# Patient Record
Sex: Male | Born: 1995 | Race: Black or African American | Hispanic: No | Marital: Single | Smoking: Never smoker
Health system: Southern US, Community
[De-identification: ages and names within clinical notes are randomized; demographics above are authoritative.]

---

## 2019-05-13 ENCOUNTER — Other Ambulatory Visit: Payer: Self-pay

## 2019-05-13 ENCOUNTER — Emergency Department
Admission: EM | Admit: 2019-05-13 | Discharge: 2019-05-13 | Disposition: A | Discharge status: Home / Self Care | Payer: BLUE CROSS/BLUE SHIELD | Attending: Emergency Medicine | Admitting: Emergency Medicine

## 2019-05-13 ENCOUNTER — Encounter: Payer: Self-pay | Admitting: Emergency Medicine

## 2019-05-13 ENCOUNTER — Emergency Department: Payer: BLUE CROSS/BLUE SHIELD

## 2019-05-13 DIAGNOSIS — M66241 Spontaneous rupture of extensor tendons, right hand: Secondary | ICD-10-CM | POA: Diagnosis not present

## 2019-05-13 DIAGNOSIS — S56419A Strain of extensor muscle, fascia and tendon of finger, unspecified finger at forearm level, initial encounter: Secondary | ICD-10-CM

## 2019-05-13 DIAGNOSIS — M79644 Pain in right finger(s): Secondary | ICD-10-CM | POA: Diagnosis present

## 2019-05-13 NOTE — Discharge Instructions (Addendum)
Follow-up with your regular hand specialist or Dr. Rosita Kea.  Please call for an appointment.  Wear the finger splint until evaluated by orthopedics

## 2019-05-13 NOTE — ED Triage Notes (Signed)
Pt to ED via POV c/o pain in right middle finger. Pt states that he was cleaning something up off the carpet and heard something pop in his finger. Pt is in NAD.

## 2019-05-13 NOTE — ED Provider Notes (Signed)
[facility] Emergency Department Provider Note  ____________________________________________   First MD Initiated Contact with Patient 05/13/19 1909     (approximate)  I have reviewed the triage vital signs and the nursing notes.   HISTORY  Chief Complaint Finger Injury    HPI Lee Wells is a 24 y.o. male presents emergency department with concerns of right middle finger pain.  States he was cleaning the carpet and felt a pop in the distal end of his finger.  Now I cannot extend the finger.  No numbness or tingling.    History reviewed. No pertinent past medical history.  There are no problems to display for this patient.   History reviewed. No pertinent surgical history.  Prior to Admission medications   Not on File    Allergies Patient has no known allergies.  No family history on file.  Social History Social History   Tobacco Use  . Smoking status: Never Smoker  . Smokeless tobacco: Never Used  Substance Use Topics  . Alcohol use: Yes  . Drug use: Not Currently    Review of Systems  Constitutional: No fever/chills Eyes: No visual changes. ENT: No sore throat. Respiratory: Denies cough Genitourinary: Negative for dysuria. Musculoskeletal: Negative for back pain.  Right middle finger pain Skin: Negative for rash. Psychiatric: no mood changes,     ____________________________________________   PHYSICAL EXAM:  VITAL SIGNS: ED Triage Vitals  Enc Vitals Group     BP 05/13/19 1853 126/69     Pulse Rate 05/13/19 1853 74     Resp 05/13/19 1853 18     Temp 05/13/19 1853 98.3 F (36.8 C)     Temp Source 05/13/19 1853 Oral     SpO2 05/13/19 1853 97 %     Weight 05/13/19 1856 238 lb (108 kg)     Height 05/13/19 1856 5\' 9"  (1.753 m)     Head Circumference --      Peak Flow --      Pain Score 05/13/19 1855 1     Pain Loc --      Pain Edu? --      Excl. in GC? --     Constitutional: Alert and oriented. Well  appearing and in no acute distress. Eyes: Conjunctivae are normal.  Head: Atraumatic. Nose: No congestion/rhinnorhea. Mouth/Throat: Mucous membranes are moist.   Neck:  supple no lymphadenopathy noted Cardiovascular: Normal rate, regular rhythm.  Respiratory: Normal respiratory effort.  No retractions, GU: deferred Musculoskeletal: Decreased range of motion of the distal portion of the right middle finger, neurovascular is intact, minimal swelling is noted  neurologic:  Normal speech and language.  Skin:  Skin is warm, dry and intact. No rash noted. Psychiatric: Mood and affect are normal. Speech and behavior are normal.  ____________________________________________   LABS (all labs ordered are listed, but only abnormal results are displayed)  Labs Reviewed - No data to display ____________________________________________   ____________________________________________  RADIOLOGY  X-ray of the right middle finger is negative  ____________________________________________   PROCEDURES  Procedure(s) performed: Finger splint applied by nursing staff   Procedures    ____________________________________________   INITIAL IMPRESSION / ASSESSMENT AND PLAN / ED COURSE  Pertinent labs & imaging results that were available during my care of the patient were reviewed by me and considered in my medical decision making (see chart for details).   Patient is a 24 year old male presents emergency department with concerns of right middle finger injury.  See HPI  Physical exam shows patient to appear well.  Right middle finger has decreased range of motion with extension.  X-rays right middle finger is negative for fracture  Finger splint was applied to the right middle finger.  Explained to the patient that it is a tendon injury not a bone injury.  He is to follow-up with orthopedics.  He already has a appointment with a hand specialist at [facility].  He can follow-up with them or either  Dr. Truitt Leep who is on-call for today.  States he understands will comply.  He was discharged stable condition.    Lee Wells was evaluated in Emergency Department on 05/13/2019 for the symptoms described in the history of present illness. He was evaluated in the context of the global COVID-19 pandemic, which necessitated consideration that the patient might be at risk for infection with the SARS-CoV-2 virus that causes COVID-19. Institutional protocols and algorithms that pertain to the evaluation of patients at risk for COVID-19 are in a state of rapid change based on information released by regulatory bodies including the CDC and federal and state organizations. These policies and algorithms were followed during the patient's care in the ED.   As part of my medical decision making, I reviewed the following data within the [place] notes reviewed and incorporated, Old chart reviewed, Notes from prior ED visits and [place] Controlled Substance Database  ____________________________________________   FINAL CLINICAL IMPRESSION(S) / ED DIAGNOSES  Final diagnoses:  Rupture of extensor tendon of finger      NEW MEDICATIONS STARTED DURING THIS VISIT:  New Prescriptions   No medications on file     Note:  This document was prepared using Dragon voice recognition software and may include unintentional dictation errors.    Versie Starks, PA-C 05/13/19 Wyatt Portela, MD 05/16/19 2039

## 2021-03-12 IMAGING — DX DG FINGER MIDDLE 2+V*R*
3 series · 3 of 3 positions shown · non-contrast
Comparison: None.

CLINICAL DATA: Right middle finger pain, injury

EXAM:
RIGHT MIDDLE FINGER 2+V

[finger ap]
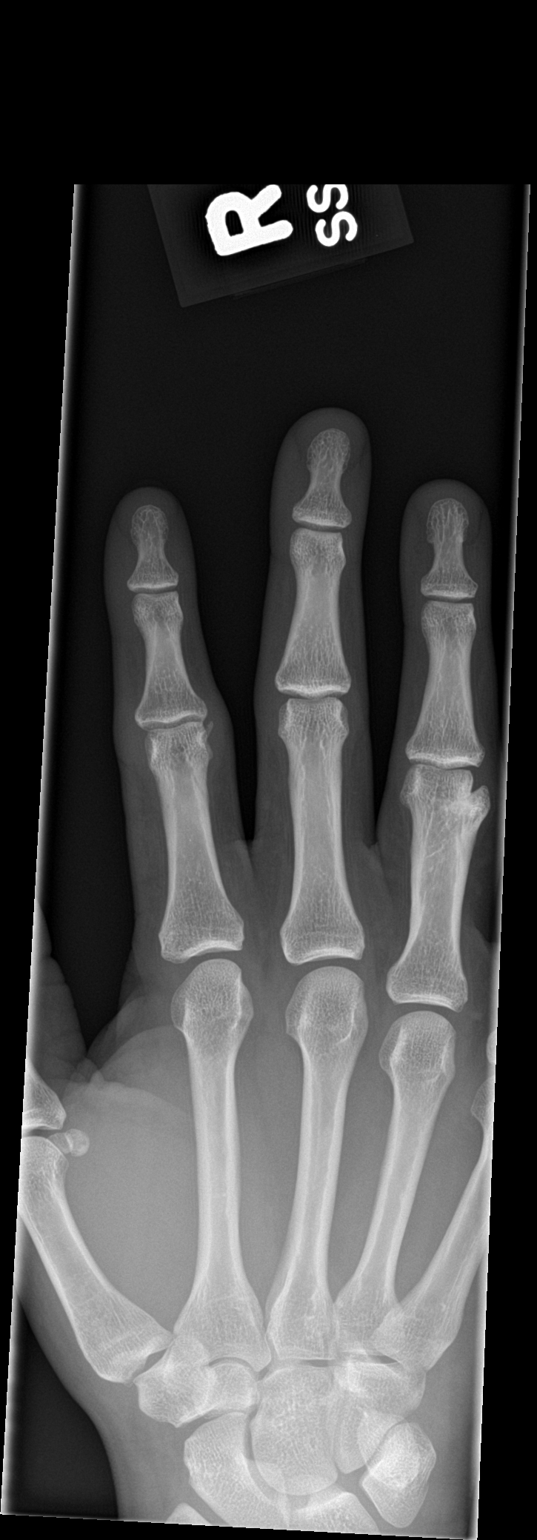

[finger obl]
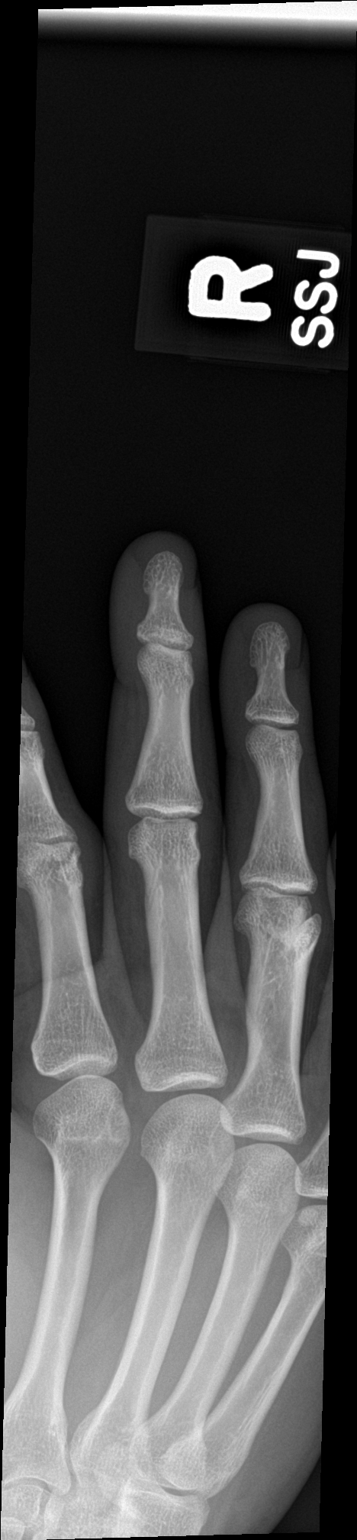

[finger lat]
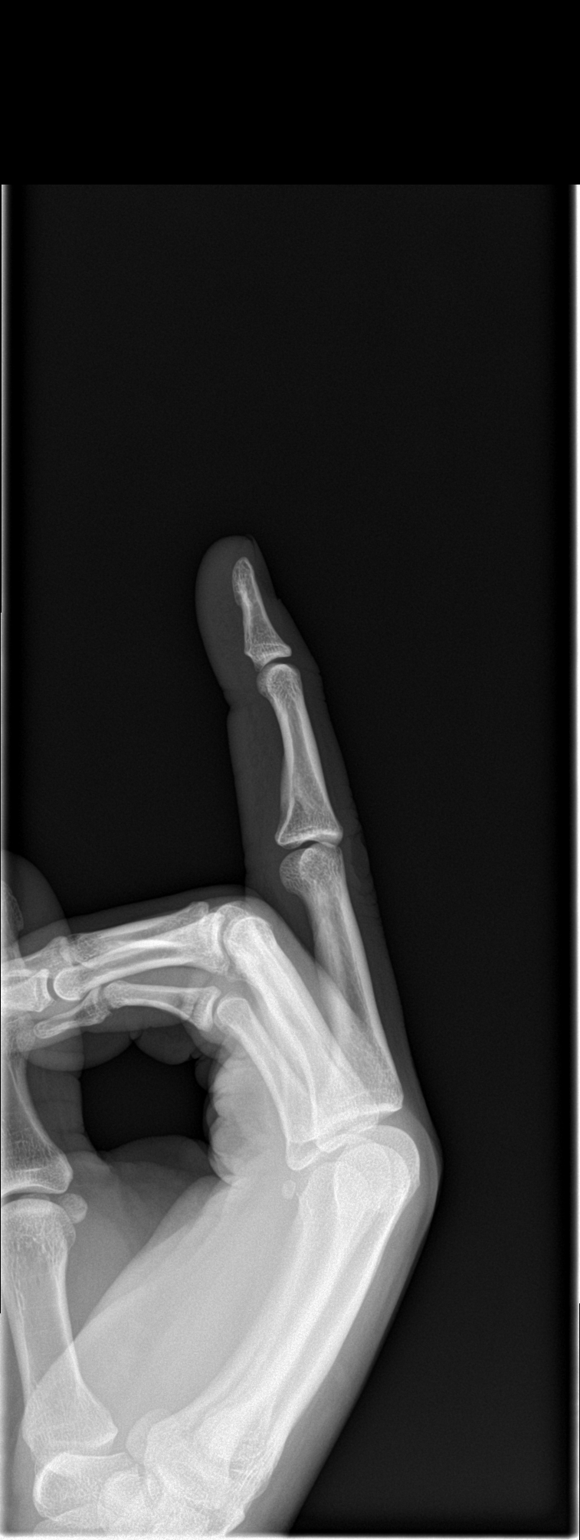

[3 of 3 positions shown; findings below may reference images not displayed]

FINDINGS: No acute bony abnormality. Specifically, no fracture, subluxation,
or dislocation. Soft tissues are intact. Bony exostosis noted off
the proximal phalanx of the right ring finger.
IMPRESSION: No acute bony abnormality.
